# Patient Record
Sex: Female | Born: 1935 | Race: Black or African American | Hispanic: No | Marital: Single | Smoking: Never smoker
Health system: Southern US, Community
[De-identification: ages and names within clinical notes are randomized; demographics above are authoritative.]

## PROBLEM LIST (undated history)

## (undated) DIAGNOSIS — I10 Essential (primary) hypertension: Secondary | ICD-10-CM

## (undated) HISTORY — PX: PACEMAKER IMPLANT: EP1218

## (undated) HISTORY — PX: JOINT REPLACEMENT: SHX530

---

## 2019-10-11 ENCOUNTER — Encounter (HOSPITAL_BASED_OUTPATIENT_CLINIC_OR_DEPARTMENT_OTHER): Payer: Self-pay | Admitting: Emergency Medicine

## 2019-10-11 ENCOUNTER — Emergency Department (HOSPITAL_BASED_OUTPATIENT_CLINIC_OR_DEPARTMENT_OTHER)
Admission: EM | Admit: 2019-10-11 | Discharge: 2019-10-12 | Disposition: A | Discharge status: Home / Self Care | Payer: No Typology Code available for payment source | Attending: Emergency Medicine | Admitting: Emergency Medicine

## 2019-10-11 ENCOUNTER — Other Ambulatory Visit: Payer: Self-pay

## 2019-10-11 ENCOUNTER — Emergency Department (HOSPITAL_BASED_OUTPATIENT_CLINIC_OR_DEPARTMENT_OTHER): Payer: No Typology Code available for payment source

## 2019-10-11 DIAGNOSIS — Y92410 Unspecified street and highway as the place of occurrence of the external cause: Secondary | ICD-10-CM | POA: Diagnosis not present

## 2019-10-11 DIAGNOSIS — Z87891 Personal history of nicotine dependence: Secondary | ICD-10-CM | POA: Diagnosis not present

## 2019-10-11 DIAGNOSIS — Y939 Activity, unspecified: Secondary | ICD-10-CM | POA: Diagnosis not present

## 2019-10-11 DIAGNOSIS — M25561 Pain in right knee: Secondary | ICD-10-CM | POA: Insufficient documentation

## 2019-10-11 DIAGNOSIS — S161XXA Strain of muscle, fascia and tendon at neck level, initial encounter: Secondary | ICD-10-CM

## 2019-10-11 DIAGNOSIS — I10 Essential (primary) hypertension: Secondary | ICD-10-CM | POA: Insufficient documentation

## 2019-10-11 DIAGNOSIS — Y9389 Activity, other specified: Secondary | ICD-10-CM | POA: Insufficient documentation

## 2019-10-11 DIAGNOSIS — S0990XA Unspecified injury of head, initial encounter: Secondary | ICD-10-CM

## 2019-10-11 DIAGNOSIS — Z950 Presence of cardiac pacemaker: Secondary | ICD-10-CM | POA: Diagnosis not present

## 2019-10-11 DIAGNOSIS — Z041 Encounter for examination and observation following transport accident: Secondary | ICD-10-CM | POA: Insufficient documentation

## 2019-10-11 DIAGNOSIS — Y999 Unspecified external cause status: Secondary | ICD-10-CM | POA: Diagnosis not present

## 2019-10-11 DIAGNOSIS — S60811A Abrasion of right wrist, initial encounter: Secondary | ICD-10-CM | POA: Insufficient documentation

## 2019-10-11 HISTORY — DX: Essential (primary) hypertension: I10

## 2019-10-11 MED ORDER — DICLOFENAC SODIUM 1 % EX GEL: 2.0000 g | Freq: Four times a day (QID) | CUTANEOUS | 0 refills | Status: AC | PRN
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
## 2019-10-11 NOTE — ED Notes (Signed)
Patient transported to CT 

## 2019-10-11 NOTE — ED Notes (Signed)
ED Provider at bedside. 
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
## 2020-02-09 ENCOUNTER — Other Ambulatory Visit: Payer: Self-pay
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
